# Patient Record
Sex: Male | Born: 2000 | Race: White | Hispanic: No | Marital: Single | Smoking: Never smoker
Health system: Southern US, Community
[De-identification: ages and names within clinical notes are randomized; demographics above are authoritative.]

---
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
## 2019-02-16 ENCOUNTER — Other Ambulatory Visit: Payer: Self-pay | Admitting: Internal Medicine

## 2019-02-18 NOTE — Telephone Encounter (Signed)
Last filled 11/16/18... NP appt 10/24/2018... note to pt to schedule CPE... please advise

## 2019-02-27 ENCOUNTER — Other Ambulatory Visit: Payer: Self-pay | Admitting: Internal Medicine

## 2019-02-28 ENCOUNTER — Other Ambulatory Visit: Payer: Self-pay | Admitting: Internal Medicine

## 2019-03-15 ENCOUNTER — Other Ambulatory Visit: Payer: Self-pay | Admitting: Internal Medicine

## 2019-03-18 ENCOUNTER — Other Ambulatory Visit: Payer: Self-pay | Admitting: *Deleted

## 2019-03-18 DIAGNOSIS — Z20822 Contact with and (suspected) exposure to covid-19: Secondary | ICD-10-CM

## 2019-03-18 NOTE — Addendum Note (Signed)
Addended by: Simon Aaberg M on: 03/18/2019 09:03 PM   Modules accepted: Orders  

## 2019-03-21 LAB — NOVEL CORONAVIRUS, NAA: SARS-CoV-2, NAA: NOT DETECTED

## 2019-04-09 ENCOUNTER — Other Ambulatory Visit: Payer: Self-pay | Admitting: Internal Medicine

## 2020-01-07 DIAGNOSIS — H6123 Impacted cerumen, bilateral: Secondary | ICD-10-CM | POA: Diagnosis not present

## 2020-04-16 ENCOUNTER — Encounter: Payer: Self-pay | Admitting: Internal Medicine

## 2020-04-16 ENCOUNTER — Other Ambulatory Visit: Payer: Self-pay

## 2020-04-16 ENCOUNTER — Telehealth (INDEPENDENT_AMBULATORY_CARE_PROVIDER_SITE_OTHER): Payer: BC Managed Care – PPO | Admitting: Internal Medicine

## 2020-04-16 DIAGNOSIS — R197 Diarrhea, unspecified: Secondary | ICD-10-CM | POA: Diagnosis not present

## 2020-04-16 NOTE — Addendum Note (Signed)
Addended by: Aquilla Solian on: 04/16/2020 11:37 AM   Modules accepted: Orders

## 2020-04-16 NOTE — Patient Instructions (Signed)

## 2020-04-16 NOTE — Addendum Note (Signed)
Addended by: Alvina Chou on: 04/16/2020 03:14 PM   Modules accepted: Orders

## 2020-04-16 NOTE — Progress Notes (Signed)
Virtual Visit via Video Note  I connected with Joseph Jensen on 04/16/20 at 10:30 AM EDT by a video enabled telemedicine application and verified that I am speaking with the correct person using two identifiers.  Location: Patient: Home Provider: Office  Persons participating in this call: Nicki Reaper, NP-C and Jennette Bill.  I discussed the limitations of evaluation and management by telemedicine and the availability of in person appointments. The patient expressed understanding and agreed to proceed.  History of Present Illness:  Pt reports diarrhea. He reports this started 1-2 weeks ago. He reports 8-10 stools per day. He reports foul odor, abdominal cramping but denies nausea, vomiting, constipation or blood in his stool. He denies fever, chills or body aches. He denies recent changes in diet or medications. He has had sick contacts diagnosed with C Diff at work and reports he was not wearing the proper precautions before the diagnosis was made. He has not taken anything OTC for his symptoms.   No past medical history on file.  Current Outpatient Medications  Medication Sig Dispense Refill  . mirtazapine (REMERON) 7.5 MG tablet TAKE 1 TABLET (7.5 MG TOTAL) BY MOUTH AT BEDTIME. 30 tablet 0  . tretinoin (RETIN-A) 0.025 % cream      No current facility-administered medications for this visit.    Allergies  Allergen Reactions  . Penicillin G Hives    Family History  Problem Relation Age of Onset  . Arthritis Mother   . Hypertension Mother   . Alcohol abuse Father   . Hypertension Father   . Hyperlipidemia Father   . Heart attack Father     Social History   Socioeconomic History  . Marital status: Single    Spouse name: Not on file  . Number of children: Not on file  . Years of education: Not on file  . Highest education level: Not on file  Occupational History  . Not on file  Tobacco Use  . Smoking status: Not on file  Substance and Sexual Activity  .  Alcohol use: Not on file  . Drug use: Not on file  . Sexual activity: Not on file  Other Topics Concern  . Not on file  Social History Narrative  . Not on file   Social Determinants of Health   Financial Resource Strain:   . Difficulty of Paying Living Expenses: Not on file  Food Insecurity:   . Worried About Programme researcher, broadcasting/film/video in the Last Year: Not on file  . Ran Out of Food in the Last Year: Not on file  Transportation Needs:   . Lack of Transportation (Medical): Not on file  . Lack of Transportation (Non-Medical): Not on file  Physical Activity:   . Days of Exercise per Week: Not on file  . Minutes of Exercise per Session: Not on file  Stress:   . Feeling of Stress : Not on file  Social Connections:   . Frequency of Communication with Friends and Family: Not on file  . Frequency of Social Gatherings with Friends and Family: Not on file  . Attends Religious Services: Not on file  . Active Member of Clubs or Organizations: Not on file  . Attends Banker Meetings: Not on file  . Marital Status: Not on file  Intimate Partner Violence:   . Fear of Current or Ex-Partner: Not on file  . Emotionally Abused: Not on file  . Physically Abused: Not on file  . Sexually Abused: Not on  file     Constitutional: Denies fever, malaise, fatigue, headache or abrupt weight changes.  Respiratory: Denies difficulty breathing, shortness of breath, cough or sputum production.   Cardiovascular: Denies chest pain, chest tightness, palpitations or swelling in the hands or feet.  Gastrointestinal: Pt reports diarrhea. Denies abdominal pain, bloating, constipation, or blood in the stool.  GU: Denies urgency, frequency, pain with urination, burning sensation, blood in urine, odor or discharge.  No other specific complaints in a complete review of systems (except as listed in HPI above).    Observations/Objective:  Wt Readings from Last 3 Encounters:  10/24/18 128 lb (58.1 kg) (12  %, Z= -1.18)*   * Growth percentiles are based on CDC (Boys, 2-20 Years) data.    General: Appears his stated age, well developed, well nourished in NAD. HEENT: Head: normal shape and size;  Pulmonary/Chest: Normal effort. No respiratory distress.  Neurological: Alert and oriented.     Assessment and Plan:  Diarrhea:  Will obtain GI pathogen panel Avoid antidiarrheals at this time Encouraged clear liquid diet, advance as tolerated Encouraged frequent handwashing  Will follow up after labs, return precautions discussed  Follow Up Instructions:    I discussed the assessment and treatment plan with the patient. The patient was provided an opportunity to ask questions and all were answered. The patient agreed with the plan and demonstrated an understanding of the instructions.   The patient was advised to call back or seek an in-person evaluation if the symptoms worsen or if the condition fails to improve as anticipated.     Nicki Reaper, NP

## 2020-04-17 LAB — GASTROINTESTINAL PATHOGEN PANEL PCR
C. difficile Tox A/B, PCR: NOT DETECTED
Campylobacter, PCR: NOT DETECTED
Cryptosporidium, PCR: NOT DETECTED
E coli (ETEC) LT/ST PCR: NOT DETECTED
E coli (STEC) stx1/stx2, PCR: NOT DETECTED
E coli 0157, PCR: NOT DETECTED
Giardia lamblia, PCR: NOT DETECTED
Norovirus, PCR: NOT DETECTED
Rotavirus A, PCR: NOT DETECTED
Salmonella, PCR: NOT DETECTED
Shigella, PCR: NOT DETECTED

## 2020-05-06 ENCOUNTER — Ambulatory Visit
Admission: EM | Admit: 2020-05-06 | Discharge: 2020-05-06 | Disposition: A | Discharge status: Home / Self Care | Payer: BC Managed Care – PPO | Attending: Family Medicine | Admitting: Family Medicine

## 2020-05-06 ENCOUNTER — Other Ambulatory Visit: Payer: Self-pay

## 2020-05-06 DIAGNOSIS — R112 Nausea with vomiting, unspecified: Secondary | ICD-10-CM

## 2020-05-06 DIAGNOSIS — R109 Unspecified abdominal pain: Secondary | ICD-10-CM | POA: Diagnosis not present

## 2020-05-06 DIAGNOSIS — R300 Dysuria: Secondary | ICD-10-CM | POA: Diagnosis not present

## 2020-05-06 DIAGNOSIS — R350 Frequency of micturition: Secondary | ICD-10-CM

## 2020-05-06 LAB — POCT URINALYSIS DIP (MANUAL ENTRY)
Bilirubin, UA: NEGATIVE
Glucose, UA: NEGATIVE mg/dL
Nitrite, UA: NEGATIVE
Protein Ur, POC: 30 mg/dL — AB
Spec Grav, UA: 1.02 (ref 1.010–1.025)
Urobilinogen, UA: 0.2 E.U./dL
pH, UA: 7.5 (ref 5.0–8.0)

## 2020-05-06 MED ORDER — TAMSULOSIN HCL 0.4 MG PO CAPS: 0.4000 mg | ORAL_CAPSULE | Freq: Every day | ORAL | 0 refills | Status: DC

## 2020-05-06 MED ORDER — IBUPROFEN 800 MG PO TABS: 800.0000 mg | ORAL_TABLET | Freq: Three times a day (TID) | ORAL | 0 refills | Status: DC | PRN

## 2020-05-06 MED ORDER — CYCLOBENZAPRINE HCL 10 MG PO TABS: 10.0000 mg | ORAL_TABLET | Freq: Two times a day (BID) | ORAL | 0 refills | Status: DC | PRN

## 2020-05-06 MED ORDER — PHENAZOPYRIDINE HCL 200 MG PO TABS: 200.0000 mg | ORAL_TABLET | Freq: Three times a day (TID) | ORAL | 0 refills | Status: DC

## 2020-05-06 NOTE — ED Triage Notes (Signed)
Pt is here with frequent urination started 2 days ago, pt states he was bit by a tick last week on the back on his left leg.

## 2020-05-06 NOTE — Discharge Instructions (Signed)
You may have a urinary tract infection.   You may be experiencing a kidney stone  We are going to culture your urine and will call you as soon as we have the results.   Drink plenty of water, 8-10 glasses per day.   You may take pyridium for painful urination.  Ibuprofen 800mg  every 8 hours as needed for pain  Cyclobenzaprine for muscle spasms. This may make you sleepy. Do not drive or operate heavy machinery while taking this medication  Take flomax daily until symptoms resolve  If symptoms are worsening intensely, follow up with the ER  Follow up with your primary care provider as needed.   Go to the Emergency Department if you experience severe pain, shortness of breath, high fever, or other concerns.

## 2020-05-06 NOTE — ED Provider Notes (Signed)
[facility]   CC: UTI  SUBJECTIVE:  Joseph Jensen is a 19 y.o. male who complains of urinary frequency, urgency and dysuria, bilateral flank pain and nausea/vomiting for the last 2 days. Patient denies a precipitating event, recent sexual encounter, excessive caffeine intake. Localizes the pain to the lower flank. Pain is intermittent and describes it as sharp. Has tried OTC medications without relief. Symptoms are made worse with urination. Admits to similar symptoms in the past. Denies fever, chills, abdominal pain, hematuria.    ROS: As in HPI.  All other pertinent ROS negative.     History reviewed. No pertinent past medical history. History reviewed. No pertinent surgical history. Allergies  Allergen Reactions  . Penicillin G Hives   No current facility-administered medications on file prior to encounter.   Current Outpatient Medications on File Prior to Encounter  Medication Sig Dispense Refill  . mirtazapine (REMERON) 7.5 MG tablet TAKE 1 TABLET (7.5 MG TOTAL) BY MOUTH AT BEDTIME. (Patient not taking: Reported on 04/16/2020) 30 tablet 0   Social History   Socioeconomic History  . Marital status: Single    Spouse name: Not on file  . Number of children: Not on file  . Years of education: Not on file  . Highest education level: Not on file  Occupational History  . Not on file  Tobacco Use  . Smoking status: Never Smoker  . Smokeless tobacco: Never Used  Substance and Sexual Activity  . Alcohol use: Never  . Drug use: Never  . Sexual activity: Not Currently    Birth control/protection: Condom  Other Topics Concern  . Not on file  Social History Narrative  . Not on file   Social Determinants of Health   Financial Resource Strain:   . Difficulty of Paying Living Expenses: Not on file  Food Insecurity:   . Worried About Programme researcher, broadcasting/film/video in the Last Year: Not on file  . Ran Out of Food in the Last Year: Not on file  Transportation Needs:   . Lack  of Transportation (Medical): Not on file  . Lack of Transportation (Non-Medical): Not on file  Physical Activity:   . Days of Exercise per Week: Not on file  . Minutes of Exercise per Session: Not on file  Stress:   . Feeling of Stress : Not on file  Social Connections:   . Frequency of Communication with Friends and Family: Not on file  . Frequency of Social Gatherings with Friends and Family: Not on file  . Attends Religious Services: Not on file  . Active Member of Clubs or Organizations: Not on file  . Attends Banker Meetings: Not on file  . Marital Status: Not on file  Intimate Partner Violence:   . Fear of Current or Ex-Partner: Not on file  . Emotionally Abused: Not on file  . Physically Abused: Not on file  . Sexually Abused: Not on file   Family History  Problem Relation Age of Onset  . Arthritis Mother   . Hypertension Mother   . Alcohol abuse Father   . Hypertension Father   . Hyperlipidemia Father   . Heart attack Father     OBJECTIVE:  Vitals:   05/06/20 1720  BP: 133/86  Pulse: 78  Resp: 17  Temp: 98 F (36.7 C)  TempSrc: Oral  SpO2: 97%   General appearance: AOx3 in no acute distress HEENT: NCAT. Oropharynx clear.  Lungs: clear to auscultation bilaterally without adventitious breath  sounds Heart: regular rate and rhythm. Radial pulses 2+ symmetrical bilaterally Abdomen: soft; non-distended; no tenderness; bowel sounds present; no guarding or rebound tenderness Back: no CVA tenderness Extremities: no edema; symmetrical with no gross deformities Skin: warm and dry Neurologic: Ambulates from chair to exam table without difficulty Psychological: alert and cooperative; normal mood and affect  Labs Reviewed  URINE CULTURE    ASSESSMENT & PLAN:  1. Bilateral flank pain   2. Urinary frequency   3. Dysuria   4. Nausea and vomiting, intractability of vomiting not specified, unspecified vomiting type     Meds ordered this encounter    Medications  . tamsulosin (FLOMAX) 0.4 MG CAPS capsule    Sig: Take 1 capsule (0.4 mg total) by mouth daily.    Dispense:  30 capsule    Refill:  0    Order Specific Question:   Supervising Provider    Answer:   Merrilee Jansky [id number]  . ibuprofen (ADVIL) 800 MG tablet    Sig: Take 1 tablet (800 mg total) by mouth every 8 (eight) hours as needed for moderate pain.    Dispense:  21 tablet    Refill:  0    Order Specific Question:   Supervising Provider    Answer:   Merrilee Jansky [id number]  . cyclobenzaprine (FLEXERIL) 10 MG tablet    Sig: Take 1 tablet (10 mg total) by mouth 2 (two) times daily as needed for muscle spasms.    Dispense:  20 tablet    Refill:  0    Order Specific Question:   Supervising Provider    Answer:   Merrilee Jansky [id number]  . phenazopyridine (PYRIDIUM) 200 MG tablet    Sig: Take 1 tablet (200 mg total) by mouth 3 (three) times daily.    Dispense:  12 tablet    Refill:  0    Order Specific Question:   Supervising Provider    Answer:   Merrilee Jansky [[id number]]   Prescribed flomax Prescribed Motrin Prescribed cyclobenzaprine Prescribed pyridium Kidney stone vs UTI Urine culture sent  We will call you with abnormal results that need further treatment Push fluids and get plenty of rest Take antibiotic as directed and to completion Take pyridium as prescribed and as needed for symptomatic relief Follow up with PCP if symptoms persists Return here or go to ER if you have any new or worsening symptoms such as fever, worsening abdominal pain, nausea/vomiting, flank pain  Outlined signs and symptoms indicating need for more acute intervention Patient verbalized understanding After Visit Summary given     Moshe Cipro, NP 05/06/20 1750

## 2020-05-09 ENCOUNTER — Emergency Department (HOSPITAL_BASED_OUTPATIENT_CLINIC_OR_DEPARTMENT_OTHER)
Admission: EM | Admit: 2020-05-09 | Discharge: 2020-05-10 | Disposition: A | Discharge status: Home / Self Care | Payer: BC Managed Care – PPO | Attending: Emergency Medicine | Admitting: Emergency Medicine

## 2020-05-09 ENCOUNTER — Emergency Department (HOSPITAL_BASED_OUTPATIENT_CLINIC_OR_DEPARTMENT_OTHER): Payer: BC Managed Care – PPO

## 2020-05-09 ENCOUNTER — Encounter (HOSPITAL_BASED_OUTPATIENT_CLINIC_OR_DEPARTMENT_OTHER): Payer: Self-pay

## 2020-05-09 ENCOUNTER — Other Ambulatory Visit: Payer: Self-pay

## 2020-05-09 DIAGNOSIS — N3001 Acute cystitis with hematuria: Secondary | ICD-10-CM | POA: Diagnosis not present

## 2020-05-09 DIAGNOSIS — N132 Hydronephrosis with renal and ureteral calculous obstruction: Secondary | ICD-10-CM | POA: Insufficient documentation

## 2020-05-09 DIAGNOSIS — Z79899 Other long term (current) drug therapy: Secondary | ICD-10-CM | POA: Insufficient documentation

## 2020-05-09 DIAGNOSIS — N23 Unspecified renal colic: Secondary | ICD-10-CM

## 2020-05-09 DIAGNOSIS — R300 Dysuria: Secondary | ICD-10-CM | POA: Diagnosis not present

## 2020-05-09 DIAGNOSIS — N202 Calculus of kidney with calculus of ureter: Secondary | ICD-10-CM | POA: Diagnosis not present

## 2020-05-09 LAB — URINALYSIS, MICROSCOPIC (REFLEX)

## 2020-05-09 LAB — CBC WITH DIFFERENTIAL/PLATELET
Abs Immature Granulocytes: 0.05 10*3/uL (ref 0.00–0.07)
Basophils Absolute: 0 10*3/uL (ref 0.0–0.1)
Basophils Relative: 0 %
Eosinophils Absolute: 0 10*3/uL (ref 0.0–0.5)
Eosinophils Relative: 0 %
HCT: 40.9 % (ref 39.0–52.0)
Hemoglobin: 13.3 g/dL (ref 13.0–17.0)
Immature Granulocytes: 0 %
Lymphocytes Relative: 11 %
Lymphs Abs: 1.6 10*3/uL (ref 0.7–4.0)
MCH: 29.9 pg (ref 26.0–34.0)
MCHC: 32.5 g/dL (ref 30.0–36.0)
MCV: 91.9 fL (ref 80.0–100.0)
Monocytes Absolute: 0.8 10*3/uL (ref 0.1–1.0)
Monocytes Relative: 5 %
Neutro Abs: 11.9 10*3/uL — ABNORMAL HIGH (ref 1.7–7.7)
Neutrophils Relative %: 84 %
Platelets: 274 10*3/uL (ref 150–400)
RBC: 4.45 MIL/uL (ref 4.22–5.81)
RDW: 13 % (ref 11.5–15.5)
WBC: 14.3 10*3/uL — ABNORMAL HIGH (ref 4.0–10.5)
nRBC: 0 % (ref 0.0–0.2)

## 2020-05-09 LAB — CK: Total CK: 130 U/L (ref 49–397)

## 2020-05-09 LAB — URINALYSIS, ROUTINE W REFLEX MICROSCOPIC
Glucose, UA: 250 mg/dL — AB
Ketones, ur: NEGATIVE mg/dL
Nitrite: POSITIVE — AB
Protein, ur: 100 mg/dL — AB
Specific Gravity, Urine: 1.015 (ref 1.005–1.030)
pH: 6.5 (ref 5.0–8.0)

## 2020-05-09 LAB — COMPREHENSIVE METABOLIC PANEL
ALT: 12 U/L (ref 0–44)
AST: 17 U/L (ref 15–41)
Albumin: 4.9 g/dL (ref 3.5–5.0)
Alkaline Phosphatase: 57 U/L (ref 38–126)
Anion gap: 11 (ref 5–15)
BUN: 8 mg/dL (ref 6–20)
CO2: 23 mmol/L (ref 22–32)
Calcium: 9.3 mg/dL (ref 8.9–10.3)
Chloride: 106 mmol/L (ref 98–111)
Creatinine, Ser: 0.82 mg/dL (ref 0.61–1.24)
GFR calc Af Amer: >60 mL/min (ref >60)
GFR calc non Af Amer: >60 mL/min (ref >60)
Glucose, Bld: 102 mg/dL — ABNORMAL HIGH (ref 70–99)
Potassium: 3.5 mmol/L (ref 3.5–5.1)
Sodium: 140 mmol/L (ref 135–145)
Total Bilirubin: 0.7 mg/dL (ref 0.3–1.2)
Total Protein: 7.4 g/dL (ref 6.5–8.1)

## 2020-05-09 MED ORDER — CEPHALEXIN 500 MG PO CAPS: 500.0000 mg | ORAL_CAPSULE | Freq: Three times a day (TID) | ORAL | 0 refills | Status: DC

## 2020-05-09 MED ORDER — KETOROLAC TROMETHAMINE 30 MG/ML IJ SOLN
30.0000 mg | Freq: Once | INTRAMUSCULAR | Status: AC
  Administered 2020-05-09: 30 mg via INTRAVENOUS
  Filled 2020-05-09: qty 1

## 2020-05-09 MED ORDER — TAMSULOSIN HCL 0.4 MG PO CAPS: 0.4000 mg | ORAL_CAPSULE | Freq: Every day | ORAL | 0 refills | Status: DC

## 2020-05-09 MED ORDER — SODIUM CHLORIDE 0.9 % IV BOLUS
1000.0000 mL | Freq: Once | INTRAVENOUS | Status: AC
  Administered 2020-05-09: 1000 mL via INTRAVENOUS

## 2020-05-09 MED ORDER — SODIUM CHLORIDE 0.9 % IV SOLN
1.0000 g | Freq: Once | INTRAVENOUS | Status: AC
  Administered 2020-05-09: 1 g via INTRAVENOUS
  Filled 2020-05-09: qty 10

## 2020-05-09 MED ORDER — IBUPROFEN 600 MG PO TABS: 600.0000 mg | ORAL_TABLET | Freq: Four times a day (QID) | ORAL | 0 refills | Status: DC | PRN

## 2020-05-09 MED ORDER — HYDROCODONE-ACETAMINOPHEN 5-325 MG PO TABS: 1.0000 | ORAL_TABLET | Freq: Four times a day (QID) | ORAL | 0 refills | Status: DC | PRN

## 2020-05-09 NOTE — ED Provider Notes (Signed)
[facility] EMERGENCY DEPARTMENT Provider Note   CSN: [id number] Arrival date & time: 05/09/20  1757     History Chief Complaint  Patient presents with  . Dysuria    Joseph Jensen is a 19 y.o. male history of depression who presenting with flank pain.  Patient went to urgent care 3 days ago with some dysuria.  Patient was noted to have some hematuria.  He had mild right flank pain at that time.  He states that his pain improved and he was given some Pyridium.  He states that today it acutely got worsened.  He had to come home from work.  He states that he may have passed some stones while he was in triage.  He has some nausea but no vomiting or fevers.  He states that he may have a remote history of kidney stones.  The history is provided by the patient.       History reviewed. No pertinent past medical history.  Patient Active Problem List   Diagnosis Date Noted  . Depression, recurrent (HCC) 10/27/2018    History reviewed. No pertinent surgical history.     Family History  Problem Relation Age of Onset  . Arthritis Mother   . Hypertension Mother   . Alcohol abuse Father   . Hypertension Father   . Hyperlipidemia Father   . Heart attack Father     Social History   Tobacco Use  . Smoking status: Never Smoker  . Smokeless tobacco: Never Used  Vaping Use  . Vaping Use: Every day  Substance Use Topics  . Alcohol use: Never  . Drug use: Never    Home Medications Prior to Admission medications   Medication Sig Start Date End Date Taking? Authorizing Provider  cyclobenzaprine (FLEXERIL) 10 MG tablet Take 1 tablet (10 mg total) by mouth 2 (two) times daily as needed for muscle spasms. 05/06/20   Moshe Cipro, NP  ibuprofen (ADVIL) 800 MG tablet Take 1 tablet (800 mg total) by mouth every 8 (eight) hours as needed for moderate pain. 05/06/20   Moshe Cipro, NP  mirtazapine (REMERON) 7.5 MG tablet TAKE 1 TABLET (7.5 MG TOTAL) BY MOUTH AT  BEDTIME. Patient not taking: Reported on 04/16/2020 03/15/19   Lorre Munroe, NP  phenazopyridine (PYRIDIUM) 200 MG tablet Take 1 tablet (200 mg total) by mouth 3 (three) times daily. 05/06/20   Moshe Cipro, NP  tamsulosin (FLOMAX) 0.4 MG CAPS capsule Take 1 capsule (0.4 mg total) by mouth daily. 05/06/20   Moshe Cipro, NP    Allergies    Penicillin g  Review of Systems   Review of Systems  Genitourinary: Positive for dysuria and flank pain.  All other systems reviewed and are negative.   Physical Exam Updated Vital Signs BP (!) 133/92 (BP Location: Right Arm)   Pulse 87   Temp 98.2 F (36.8 C) (Oral)   Resp (!) 21   Ht 5\' 11"  (1.803 m)   Wt 65.8 kg   SpO2 100%   BMI 20.22 kg/m   Physical Exam Vitals and nursing note reviewed.  Constitutional:      Comments: Slightly uncomfortable   HENT:     Head: Normocephalic.     Mouth/Throat:     Mouth: Mucous membranes are moist.  Eyes:     Extraocular Movements: Extraocular movements intact.     Pupils: Pupils are equal, round, and reactive to light.  Cardiovascular:     Rate and Rhythm: Normal rate  and regular rhythm.     Pulses: Normal pulses.     Heart sounds: Normal heart sounds.  Pulmonary:     Effort: Pulmonary effort is normal.     Breath sounds: Normal breath sounds.  Abdominal:     General: Abdomen is flat.     Palpations: Abdomen is soft.     Comments: Mild R CVAT   Musculoskeletal:        General: Normal range of motion.     Cervical back: Normal range of motion.  Skin:    General: Skin is warm.     Capillary Refill: Capillary refill takes less than 2 seconds.  Neurological:     General: No focal deficit present.     Mental Status: He is alert and oriented to person, place, and time.  Psychiatric:        Mood and Affect: Mood normal.        Behavior: Behavior normal.     ED Results / Procedures / Treatments   Labs (all labs ordered are listed, but only abnormal results are  displayed) Labs Reviewed  URINALYSIS, ROUTINE W REFLEX MICROSCOPIC - Abnormal; Notable for the following components:      Result Value   Color, Urine ORANGE (*)    APPearance HAZY (*)    Glucose, UA 250 (*)    Hgb urine dipstick LARGE (*)    Bilirubin Urine MODERATE (*)    Protein, ur 100 (*)    Nitrite POSITIVE (*)    Leukocytes,Ua TRACE (*)    All other components within normal limits  URINALYSIS, MICROSCOPIC (REFLEX) - Abnormal; Notable for the following components:   Bacteria, UA FEW (*)    All other components within normal limits  URINE CULTURE  CBC WITH DIFFERENTIAL/PLATELET  COMPREHENSIVE METABOLIC PANEL  CK    EKG None  Radiology No results found.  Procedures Procedures (including critical care time)  Medications Ordered in ED Medications  sodium chloride 0.9 % bolus 1,000 mL (has no administration in time range)  cefTRIAXone (ROCEPHIN) 1 g in sodium chloride 0.9 % 100 mL IVPB (has no administration in time range)    ED Course  I have reviewed the triage vital signs and the nursing notes.  Pertinent labs & imaging results that were available during my care of the patient were reviewed by me and considered in my medical decision making (see chart for details).    MDM Rules/Calculators/A&P                          [name] is a 19 y.o. male here presenting with right flank pain and hematuria and dysuria.  Likely UTI versus Pyelo versus infected kidney stone.  Patient is afebrile and is well-appearing.  Plan to get CBC and CMP and repeat urinalysis and CT renal stone.  10:24 PM  Patient's labs unremarkable.  WBC is 14.  UA showed possible UTI.  Patient does have leuks and nitrates and some bacteria.  Patient also has large amount of blood.  CT showed mild right hydro with 2 to 3 mm stone.  Patient's pain is controlled with Toradol.  Also given Rocephin.  Will discharge patient home with Flomax, Keflex, motrin, short course of vicodin.    Final Clinical  Impression(s) / ED Diagnoses Final diagnoses:  None    Rx / DC Orders ED Discharge Orders    None       Charlynne Pander, MD  05/09/20 2228  

## 2020-05-09 NOTE — Discharge Instructions (Addendum)
Take Keflex 3 times daily for a week for UTI.  Take Motrin for pain  Take Vicodin for severe pain.  Take Flomax daily.  You still have a kidney stone so please see urology for follow-up.  Stay hydrated.  Return to ER if you have worse flank pain, blood in the urine, fever, vomiting.

## 2020-05-09 NOTE — ED Triage Notes (Signed)
Pt c/o dysuria and back pain. Pt seen at [facility] and was told there was blood in his urine. Pt thinks he saw "little stones" come out in the toilet, but pain still persists.

## 2020-05-09 NOTE — ED Notes (Signed)
RN spoke with Josh from lab to notify of urine culture order.

## 2020-05-10 MED ORDER — IBUPROFEN 600 MG PO TABS: 600.0000 mg | ORAL_TABLET | Freq: Four times a day (QID) | ORAL | 0 refills | Status: AC | PRN

## 2020-05-10 MED ORDER — CEPHALEXIN 500 MG PO CAPS: 500.0000 mg | ORAL_CAPSULE | Freq: Three times a day (TID) | ORAL | 0 refills | Status: DC

## 2020-05-10 MED ORDER — HYDROCODONE-ACETAMINOPHEN 5-325 MG PO TABS: 1.0000 | ORAL_TABLET | Freq: Four times a day (QID) | ORAL | 0 refills | Status: DC | PRN

## 2020-05-10 MED ORDER — TAMSULOSIN HCL 0.4 MG PO CAPS: 0.4000 mg | ORAL_CAPSULE | Freq: Every day | ORAL | 0 refills | Status: DC

## 2020-05-11 DIAGNOSIS — L700 Acne vulgaris: Secondary | ICD-10-CM | POA: Diagnosis not present

## 2020-05-11 LAB — URINE CULTURE: Culture: NO GROWTH

## 2022-03-02 DIAGNOSIS — H6123 Impacted cerumen, bilateral: Secondary | ICD-10-CM | POA: Diagnosis not present

## 2022-03-02 DIAGNOSIS — H900 Conductive hearing loss, bilateral: Secondary | ICD-10-CM | POA: Diagnosis not present

## 2022-05-27 IMAGING — CT CT RENAL STONE PROTOCOL
2 of 4 series · 16 of 46 positions shown, 18 images · non-contrast
Comparison: None.

CLINICAL DATA: Flank pain

EXAM:
CT ABDOMEN AND PELVIS WITHOUT CONTRAST
TECHNIQUE: Multidetector CT imaging of the abdomen and pelvis was performed
following the standard protocol without IV contrast.

[Series 2: axial st · axial · 0.65mm/px · z∈[-451,-61]mm · 13 of 86 slices shown, 15 images]
[im 4/86  soft-tissue]
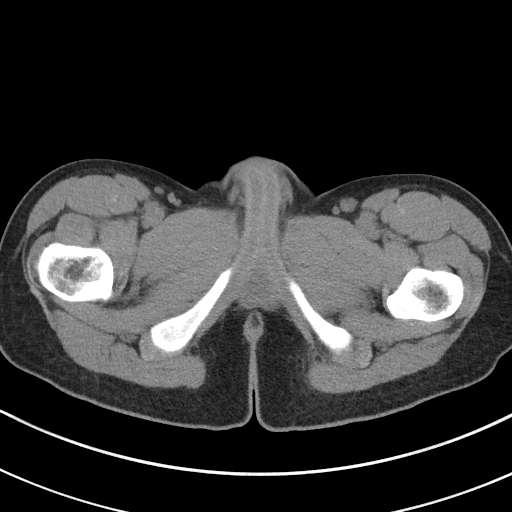
[im 4/86  bone]
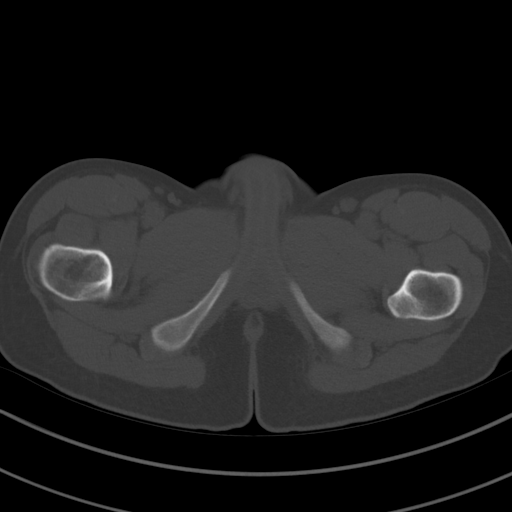
[im 11/86  soft-tissue]
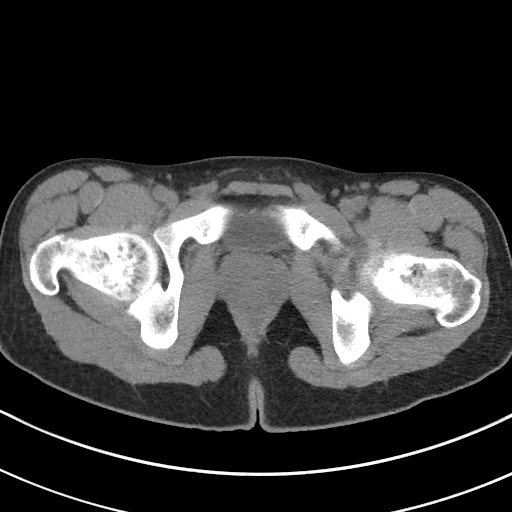
[im 18/86  soft-tissue]
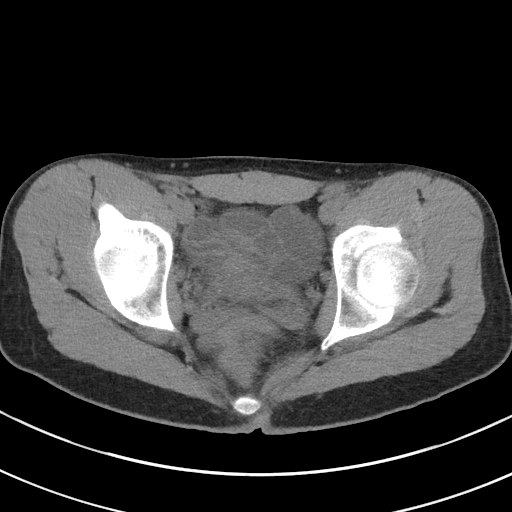
[im 24/86  soft-tissue]
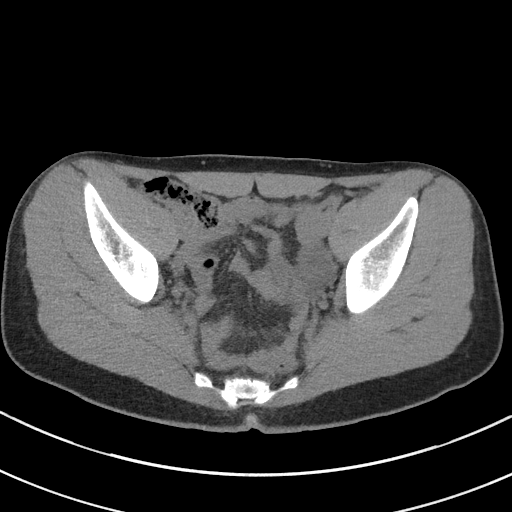
[im 31/86  soft-tissue]
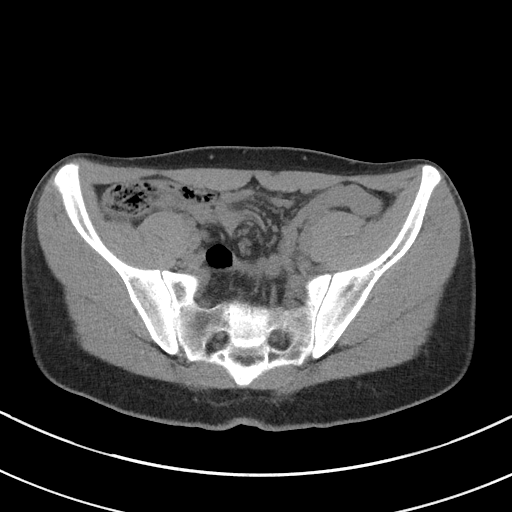
[im 38/86  soft-tissue]
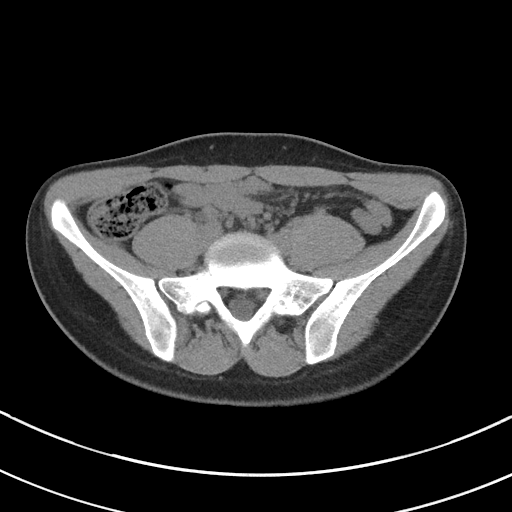
[im 45/86  soft-tissue]
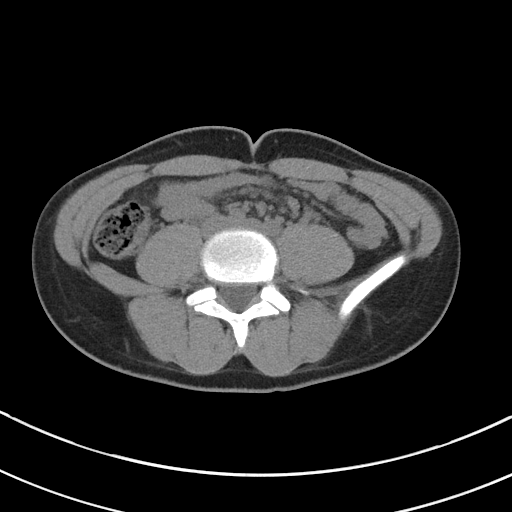
[im 48/86  soft-tissue]
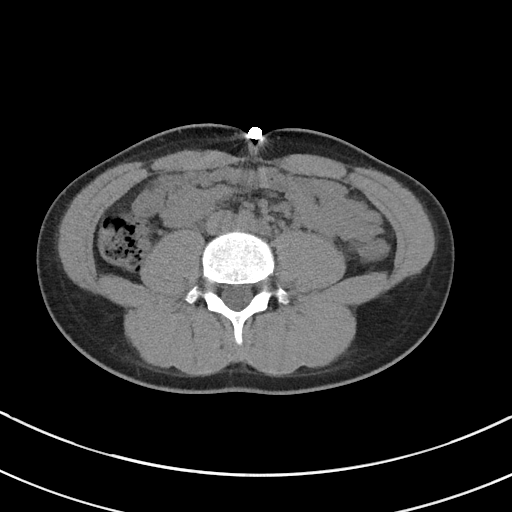
[im 55/86  soft-tissue]
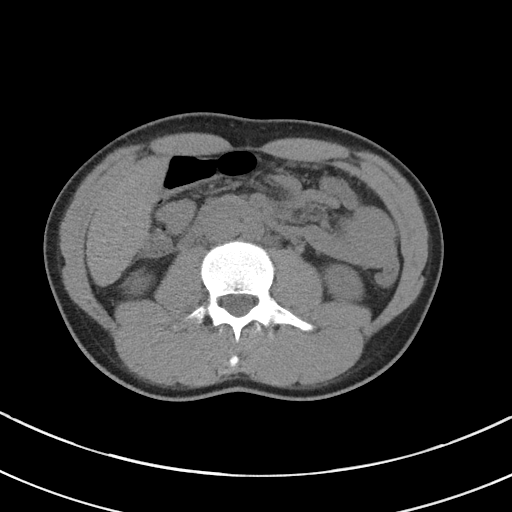
[im 55/86  bone]
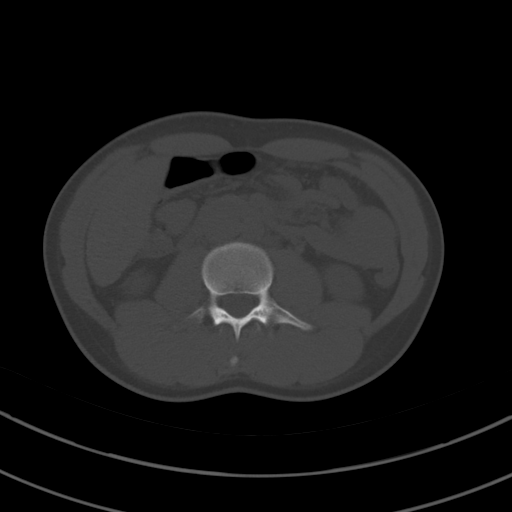
[im 62/86  soft-tissue]
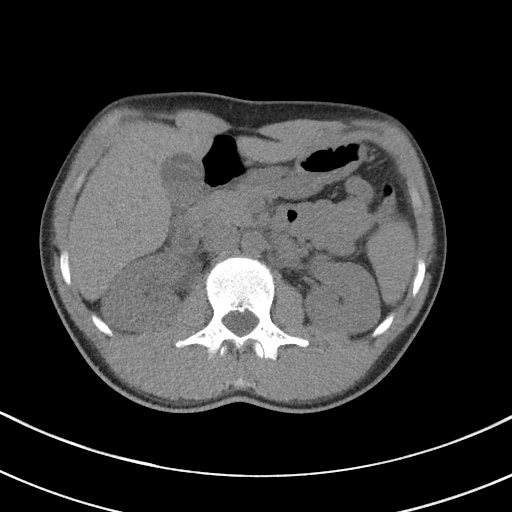
[im 69/86  soft-tissue]
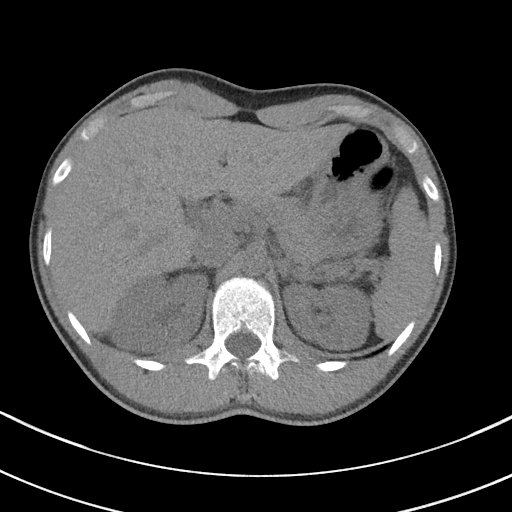
[im 75/86  soft-tissue]
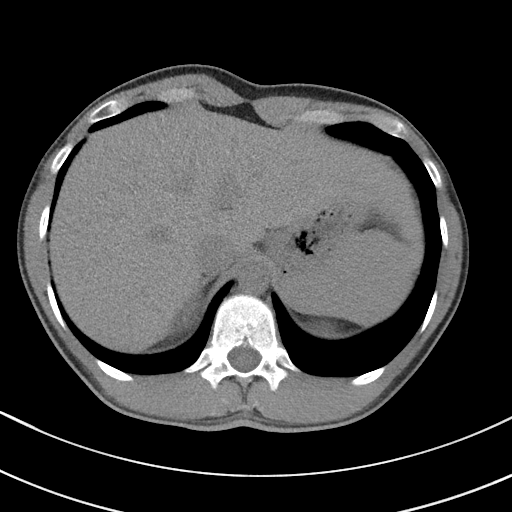
[im 82/86  soft-tissue]
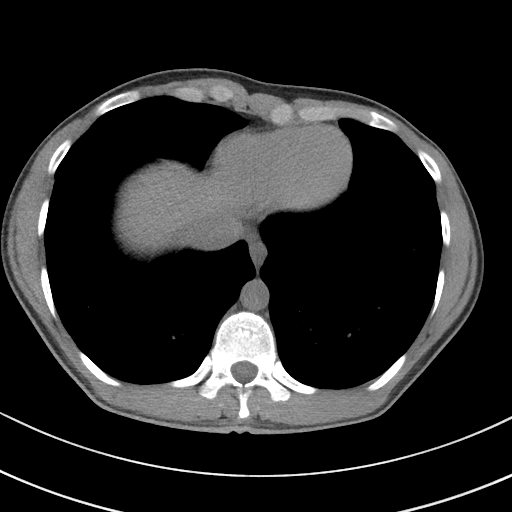

[Series 4: coronal st · coronal · 0.76mm/px · 3 of 80 slices shown]
[im 27/80  soft-tissue]
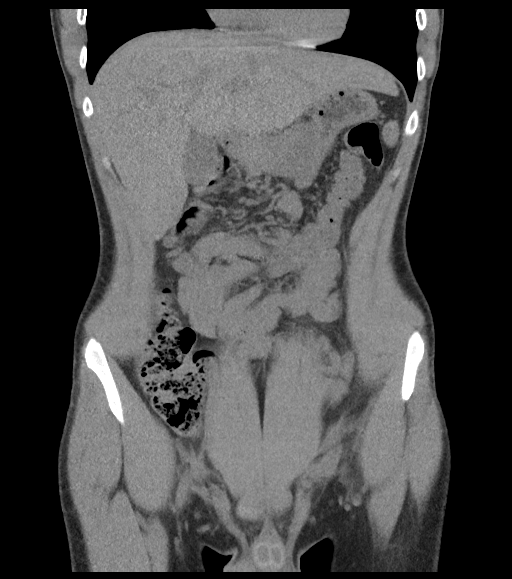
[im 36/80  soft-tissue]
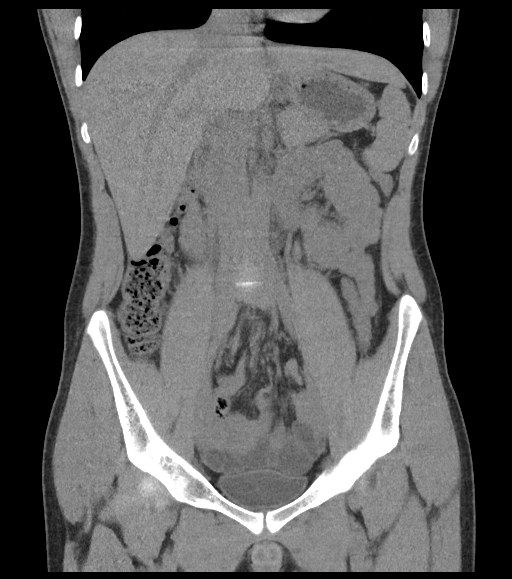
[im 44/80  soft-tissue]
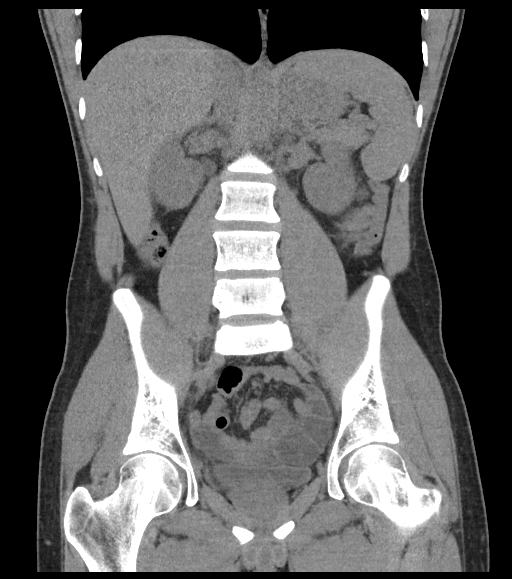

[16 of 46 positions shown; findings below may reference images not displayed]

FINDINGS: Lower chest: The lung bases are clear. The heart size is normal.

Hepatobiliary: The liver is normal. There is gallbladder sludge
versus small gallstones within the gallbladder lumen.There is no
biliary ductal dilation.

Pancreas: Normal contours without ductal dilatation. No
peripancreatic fluid collection.

Spleen: Unremarkable.

Adrenals/Urinary Tract:

--Adrenal glands: Unremarkable.

--Right kidney/ureter: There is mild right-sided collecting system
dilatation there appears to be secondary to an obstructing 2-3 mm
stone in the distal right ureter (axial series 2, image 70). There
are additional punctate nonobstructing stones in the upper pole the
right kidney.

--Left kidney/ureter: No hydronephrosis or radiopaque kidney stones.

--Urinary bladder: Unremarkable.

Stomach/Bowel:

--Stomach/Duodenum: No hiatal hernia or other gastric abnormality.
Normal duodenal course and caliber.

--Small bowel: Unremarkable.

--Colon: Unremarkable.

--Appendix: Normal.

Vascular/Lymphatic: Normal course and caliber of the major abdominal
vessels.

--No retroperitoneal lymphadenopathy.

--No mesenteric lymphadenopathy.

--No pelvic or inguinal lymphadenopathy.

Reproductive: Unremarkable

Other: No ascites or free air. The abdominal wall is normal.

Musculoskeletal. No acute displaced fractures.
IMPRESSION: 1. Mild right-sided collecting system dilatation secondary to an
obstructing 2-3 mm stone in the distal right ureter.
2. Additional punctate nonobstructing stones in the upper pole the
right kidney.
3. Gallbladder sludge versus small gallstones within the gallbladder
lumen. No CT evidence for acute cholecystitis.

## 2022-07-12 DIAGNOSIS — L0889 Other specified local infections of the skin and subcutaneous tissue: Secondary | ICD-10-CM | POA: Diagnosis not present

## 2022-07-12 DIAGNOSIS — L0109 Other impetigo: Secondary | ICD-10-CM | POA: Diagnosis not present

## 2022-08-12 DIAGNOSIS — L0101 Non-bullous impetigo: Secondary | ICD-10-CM | POA: Diagnosis not present

## 2022-08-12 DIAGNOSIS — Z79899 Other long term (current) drug therapy: Secondary | ICD-10-CM | POA: Diagnosis not present

## 2022-08-12 DIAGNOSIS — L0109 Other impetigo: Secondary | ICD-10-CM | POA: Diagnosis not present

## 2022-10-26 ENCOUNTER — Ambulatory Visit: Payer: BC Managed Care – PPO | Admitting: Infectious Disease

## 2022-10-26 ENCOUNTER — Encounter: Payer: Self-pay | Admitting: Infectious Disease

## 2022-10-26 ENCOUNTER — Other Ambulatory Visit: Payer: Self-pay

## 2022-10-26 VITALS — BP 126/83 | HR 91 | Temp 98.1°F | Ht 70.0 in | Wt 137.0 lb

## 2022-10-26 DIAGNOSIS — L0100 Impetigo, unspecified: Secondary | ICD-10-CM | POA: Diagnosis not present

## 2022-10-26 DIAGNOSIS — Z880 Allergy status to penicillin: Secondary | ICD-10-CM | POA: Diagnosis not present

## 2022-10-26 DIAGNOSIS — Z22321 Carrier or suspected carrier of Methicillin susceptible Staphylococcus aureus: Secondary | ICD-10-CM | POA: Diagnosis not present

## 2022-10-26 MED ORDER — DOXYCYCLINE HYCLATE 100 MG PO CAPS: 100.0000 mg | ORAL_CAPSULE | Freq: Two times a day (BID) | ORAL | 1 refills | Status: AC

## 2022-10-26 MED ORDER — MUPIROCIN 2 % EX OINT: 1.0000 | TOPICAL_OINTMENT | Freq: Two times a day (BID) | CUTANEOUS | 1 refills | Status: AC

## 2022-10-26 NOTE — Progress Notes (Signed)
Subjective:  Reason for infectious disease consult recurrent soft tissue infection with methicillin sensitive Staphylococcus aureus impetigo  Resting physician Harriett Sine, MD  PCP: Webb Silversmith, NP   Patient ID: Joseph Jensen, male    DOB: 11-07-2000, 22 y.o.   MRN: [id number]  HPI  Joseph Jensen is a very pleasant 22 year old 45 man who continues to have recurrent impetigo like skin lesions typically on his face and scalp since December of this year.  He has been followed by Dr. Salley Scarlet with [facility] dermatology.  He has been treated several times with doxycycline twice daily including cleaning more recently for 30-day course along with mupirocin ointment ointment intranasally.  He is also being given topical steroids as well when the inflammation is severe.  He tells me that every time he stops antibiotics the lesions recur.  He says they are typically happen on his face but not the area where he shaves and more recently had recurred in his scalp.  He has not had on his torso whatsoever.  With his mother at home but no other close family contacts.    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  Family History  Problem Relation Age of Onset   Arthritis Mother    Hypertension Mother    Alcohol abuse Father    Hypertension Father    Hyperlipidemia Father    Heart attack Father       Social History   Socioeconomic History   Marital status: Single    Spouse name: Not on file   Number of children: Not on file   Years of education: Not on file   Highest education level: Not on file  Occupational History   Not on file  Tobacco Use   Smoking status: Never   Smokeless tobacco: Never  Vaping Use   Vaping Use: Every day  Substance and Sexual Activity   Alcohol use: Never   Drug use: Never    Sexual activity: Not Currently    Birth control/protection: Condom  Other Topics Concern   Not on file  Social History Narrative   Not on file   Social Determinants of Health   Financial Resource Strain: Not on file  Food Insecurity: Not on file  Transportation Needs: Not on file  Physical Activity: Not on file  Stress: Not on file  Social Connections: Not on file    Allergies  Allergen Reactions   Penicillin G Hives     Current Outpatient Medications:    Fluocinolone Acetonide Scalp 0.01 % OIL, Apply topically., Disp: , Rfl:    ibuprofen (ADVIL) 600 MG tablet, Take 1 tablet (600 mg total) by mouth every 6 (six) hours as needed., Disp: 30 tablet, Rfl: 0   ISOtretinoin (CLARAVIS) 40 MG capsule, Take 40 mg by mouth daily., Disp: , Rfl:    tretinoin (RETIN-A) 0.1 % cream, SMARTSIG:Sparingly Topical Every Night, Disp: , Rfl:    doxycycline (VIBRAMYCIN) 100 MG capsule, Take 1 capsule (100 mg total) by mouth 2 (two) times daily., Disp: 60 capsule, Rfl: 1   mupirocin ointment (BACTROBAN) 2 %, Apply 1 Application topically 2 (two) times daily. Apply intranasally twice daily for 7 days, Disp: 30 g, Rfl: 1    Review of Systems  Constitutional:  Negative for activity change, appetite change, chills, diaphoresis, fatigue, fever and unexpected weight change.  HENT:  Negative for congestion, rhinorrhea, sinus pressure, sneezing, sore throat and trouble swallowing.   Eyes:  Negative for photophobia and visual disturbance.  Respiratory:  Negative for cough, chest tightness, shortness of breath, wheezing and stridor.   Cardiovascular:  Negative for chest pain, palpitations and leg swelling.  Gastrointestinal:  Negative for abdominal distention, abdominal pain, anal bleeding, blood in stool, constipation, diarrhea, nausea and vomiting.  Genitourinary:  Negative for difficulty urinating, dysuria, flank pain and hematuria.  Musculoskeletal:  Negative for arthralgias, back pain, gait problem,  joint swelling and myalgias.  Skin:  Negative for color change, pallor, rash and wound.  Neurological:  Negative for dizziness, tremors, weakness and light-headedness.  Hematological:  Negative for adenopathy. Does not bruise/bleed easily.  Psychiatric/Behavioral:  Negative for agitation, behavioral problems, confusion, decreased concentration, dysphoric mood and sleep disturbance.        Objective:   Physical Exam Constitutional:      Appearance: He is well-developed.  HENT:     Head: Normocephalic and atraumatic.  Eyes:     Conjunctiva/sclera: Conjunctivae normal.  Cardiovascular:     Rate and Rhythm: Normal rate and regular rhythm.  Pulmonary:     Effort: Pulmonary effort is normal. No respiratory distress.     Breath sounds: No wheezing.  Abdominal:     General: There is no distension.     Palpations: Abdomen is soft.  Musculoskeletal:        General: No tenderness. Normal range of motion.     Cervical back: Normal range of motion and neck supple.  Skin:    General: Skin is warm and dry.     Coloration: Skin is not pale.     Findings: No erythema or rash.  Neurological:     General: No focal deficit present.     Mental Status: He is alert and oriented to person, place, and time.  Psychiatric:        Mood and Affect: Mood normal.        Behavior: Behavior normal.        Thought Content: Thought content normal.        Judgment: Judgment normal.           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  Therefore we will go with a regimen of doxycycline 100 mg twice daily for 2 months and have him do a decolonization  regimen of intranasal mupirocin twice daily along with nightly showers with Hibiclens.  He is to not apply any topical agents 1 hour after showering.  We may need to consider try to decolonize his mother as well.  We discussed the options of bleach baths as well but this is not especially appealing to him in terms of where the lesions are recurring.  I would like him to see how he does without the use of corticosteroids   Penicillin allergy: Would be nice to see if he has been on a first-generation cephalosporin  I spent 62  minutes with the patient including than 50% of the time in face to face counseling of the patient the nature of MSSA colonization impetigo  along  with review of medical records in preparation for the visit and during the visit and in coordination of his care.

## 2022-10-26 NOTE — Patient Instructions (Signed)
Purchase Hibiclens generic soap  Wash nightly with this in shower from head to toe and do not apply deodorant or topical agents until one hour has passed  Do this for 7 days while you also put mupirocin in nostrils TWICE a day  While on doxycycline

## 2023-01-25 ENCOUNTER — Ambulatory Visit: Payer: BC Managed Care – PPO | Admitting: Infectious Disease
